# Patient Record
Sex: Male | Born: 1970 | Race: White | Hispanic: No | Marital: Married | Smoking: Light tobacco smoker
Health system: Southern US, Community
[De-identification: ages and names within clinical notes are randomized; demographics above are authoritative.]

## PROBLEM LIST (undated history)

## (undated) HISTORY — PX: BACK SURGERY: SHX140

---

## 2003-02-09 ENCOUNTER — Encounter: Admission: RE | Admit: 2003-02-09 | Discharge: 2003-02-09 | Payer: Self-pay | Admitting: Neurosurgery

## 2003-02-09 ENCOUNTER — Encounter: Payer: Self-pay | Admitting: Neurosurgery

## 2003-05-12 ENCOUNTER — Ambulatory Visit (HOSPITAL_COMMUNITY): Admission: RE | Admit: 2003-05-12 | Discharge: 2003-05-12 | Payer: Self-pay | Admitting: Neurosurgery

## 2003-07-14 ENCOUNTER — Ambulatory Visit (HOSPITAL_COMMUNITY): Admission: RE | Admit: 2003-07-14 | Discharge: 2003-07-14 | Payer: Self-pay | Admitting: Orthopaedic Surgery

## 2003-08-09 ENCOUNTER — Encounter: Admission: RE | Admit: 2003-08-09 | Discharge: 2003-08-09 | Payer: Self-pay | Admitting: Orthopaedic Surgery

## 2009-05-07 HISTORY — PX: WRIST ARTHROSCOPY: SUR100

## 2009-11-18 ENCOUNTER — Ambulatory Visit (HOSPITAL_BASED_OUTPATIENT_CLINIC_OR_DEPARTMENT_OTHER): Admission: RE | Admit: 2009-11-18 | Discharge: 2009-11-18 | Payer: Self-pay | Admitting: Orthopedic Surgery

## 2010-05-27 ENCOUNTER — Encounter: Payer: Self-pay | Admitting: Neurosurgery

## 2010-09-15 ENCOUNTER — Encounter (HOSPITAL_BASED_OUTPATIENT_CLINIC_OR_DEPARTMENT_OTHER): Payer: Self-pay

## 2010-09-22 NOTE — Op Note (Signed)
NAME:  KIMBLE, HITCHENS                            ACCOUNT NO.:  [id number]   MEDICAL RECORD NO.:  [id number]                   PATIENT TYPE:  OIB   LOCATION:  2550                                 FACILITY:  [facility]   PHYSICIAN:  Sharolyn Douglas, M.D.                     DATE OF BIRTH:  07/04/1970   DATE OF PROCEDURE:  DATE OF DISCHARGE:  07/14/2003                                 OPERATIVE REPORT   DIAGNOSIS:  Left L5-S1 herniated nucleus pulposus with radiculopathy.   PROCEDURE:  L5-S1 microdiskectomy and lateral recess decompression.   SURGEON:  Sharolyn Douglas, M.D.   ASSISTANT:  Verlin Fester, P.A.   ANESTHESIA:  General endotracheal.   COMPLICATIONS:  None.   INDICATIONS FOR PROCEDURE:  The patient is a 40 year old male with L5-S1  disk herniation and left S1 radiculopathy secondary to a work-related injury  occurring on July 13, 2002.  He has had a long and appropriate course of  conservative treatment.  He has seen multiple physicians and has had a  second opinion.  Risks, benefits, alternatives to L5-S1 microdiskectomy were  extensively reviewed with the patient.  He elected to proceed.  He does have  some degree of disk degeneration at L4-L5 as well.  He understands that he  may continue to have symptoms from his degenerative joint disease and may  have further disk degeneration requiring additional surgery.  The patient  elected to proceed.   PROCEDURE:  The patient was properly identified in the holding area and  taken to the operating room.  He underwent  general endotracheal anesthesia  without difficulty.  He was given prophylactic IV antibiotics.  He was  carefully turned prone on to the [place] frame.  Only bony prominences were  padded.  Face and eyes were protected at all times.  The back was prepped  and draped in the usual sterile fashion.  Fluoroscopy  was brought into the  field, and the L5-S1 level identified.  A 1.5 cm- incision was made just off  the midline on the  left side over the L5-S1 interspace.  Deep fascia was  incised.  Dilators from the [facility] retractor were used to spread the  paraspinal muscles, docking on the L5-S1 interspace.  This was done using  fluoroscopy.  We then placed the MaXcess retractor over the final dilator  with the 60-mm blades.  The retractor was attached to the table.  The  retractor was spread, dilating the paraspinal muscles.  The microscope was  draped and brought into the field.  The small amount of muscle overlying the  L5-S1 interspace was then removed using a pituitary rongeur.  High-speed  burr was used to remove the inferior one-third of the L5 lamina as well as  the medial one-third of the facet joint complex.  The ligamentum flavum was  elevated piecemeal.  The transversing S1 nerve  root was identified, and  gently mobilized medially.  Bleeding was controlled by Bovie electrocautery  and Gelfoam.  The disk space was entered and the loose disk material was  removed using pituitary rongeurs.  An Epstein curette was used to decompress  the herniated disk material back into the interspace and then removed with  the pituitary sparred.  When we were done, we felt confident the S1 nerve  root was free from its takeoff out its respective foramen using a blunt  probe.   The wound was copiously irrigated.  The disk space was irrigated using  Angiocath irrigation.  Several small fragments were floated out and removed  with the pituitary.  Fentanyl 2 mL was left in the epidural space for  postoperative anesthesia.  The MaXcess retractor was removed.  The fascia  was closed with O-Vicryl suture.  Subcutaneous layer closed with 2-0 Vicryl followed by Dermabond to  approximate the skin edges.  The patient was turned supine and extubated  without difficulty, and transferred to the recovery room in stable  condition, able to move his upper and lower extremities.                                               Sharolyn Douglas,  M.D.    MC/MEDQ  D:  07/14/2003  T:  07/14/2003  Job:  [id number]

## 2010-09-22 NOTE — H&P (Signed)
NAME:  Harold Burns, Harold Burns                            ACCOUNT NO.:  [id number]   MEDICAL RECORD NO.:  [id number]                   PATIENT TYPE:  OIB   LOCATION:  NA                                   FACILITY:  [facility]   PHYSICIAN:  Sharolyn Douglas, M.D.                     DATE OF BIRTH:  04/13/1971   DATE OF ADMISSION:  07/12/2003  DATE OF DISCHARGE:                                HISTORY & PHYSICAL   CHIEF COMPLAINT:  Pain in my back and left leg, with cramping in the right  leg.   HISTORY OF PRESENT ILLNESS:  This 40-yer-old white male is seen by [place] for  continuing and progressive problems concerning pain in his low back with  radiation into both extremities, now more so on the left than the right.  The patient has an injury which occurred on July 14, 2002.  He was loading  some gasoline and fell into his left posterolateral hip area.  Unfortunately  he has continued with progressive pain of an intermittent nature.  He had  tried to continue with his usual work.  He has had early treatment for a  lumbar strain at [facility].  He had chiropractic treatment for awhile.  He received various anti-inflammatories, including IM Toradol.  He has had  conservative treatment by Dr. Caralyn Guile. Ramos.  Due to his continuing  persistent problems and positive findings seen on an MRI of November 26, 2002,  with degenerative disk and facet disease, as well as L5-S1 disk protrusion  abutting the S1 nerve roots bilaterally.  The patient is scheduled for a  microdiskectomy at L5-S1.   PAST MEDICAL HISTORY:  This gentleman has been in excellent health  throughout his life time.  No serious medical illnesses.   ALLERGIES:  TETANUS TOXOID.   PAST SURGICAL HISTORY:  He has had no previous surgeries.   FAMILY HISTORY:  Positive for hypertension and cancer.   SOCIAL HISTORY:  The patient drives a truck.  He is married with one son.  He has no intake of alcohol or tobacco products.   REVIEW OF SYSTEMS:  CNS:  No  seizures, stroke, paralysis, or double vision,  but the patient does have sensory changes consistent with nerve root  impingement in the lower extremities.  CARDIOVASCULAR:  No chest pain, no  angina, no orthopnea.  RESPIRATORY:  No productive cough, no hemoptysis, no  shortness of breath.  GASTROINTESTINAL:  No nausea, vomiting, melena, bloody  stools.  GENITOURINARY:  No discharge, hematuria, or dysuria.  MUSCULOSKELETAL:  Primarily in the history of present illness.   PHYSICAL EXAMINATION:  GENERAL:  Alert and cooperative, friendly, 40-year-  old white male accompanied by his wife and son.  VITAL SIGNS:  Blood pressure 120/74, pulse 72, respirations 12.  HEENT:  Normocephalic.  Pupils equal, round, reactive to light and  accommodation.  EOMs  intact.  Oropharynx clear.  CHEST:  Clear to auscultation.  No rhonchi or rales.  HEART:  A regular rate and rhythm.  No murmurs are heard.  ABDOMEN:  Soft, nontender.  Liver and spleen not felt.  GENITOURINARY:  Not done, no pertinent to the present illness.  RECTAL:  Not done, not pertinent to the present illness.  EXTREMITIES:  The patient has limitation of range of motion in the lumbar  spine, with normal strength in the lower extremities and normal reflexes.   ADMISSION DIAGNOSES:  Herniated nucleus pulposus L5-S1, central.   PLAN:  The patient is to undergo a microdiskectomy at L5-S1 in [place]. [facility].  He will be fitted with a lumbar support, to assist him  in the healing process.      Dooley L. Cherlynn June.                 Sharolyn Douglas, M.D.    DLU/MEDQ  D:  07/12/2003  T:  07/12/2003  Job:  [id number]

## 2011-07-25 ENCOUNTER — Ambulatory Visit: Payer: Self-pay | Admitting: Family Medicine

## 2013-05-28 ENCOUNTER — Ambulatory Visit (INDEPENDENT_AMBULATORY_CARE_PROVIDER_SITE_OTHER): Payer: Commercial Managed Care - PPO | Admitting: Physician Assistant

## 2013-05-28 ENCOUNTER — Encounter: Payer: Self-pay | Admitting: Physician Assistant

## 2013-05-28 VITALS — BP 118/68 | HR 86 | Temp 98.0°F | Resp 16 | Ht 71.0 in | Wt 193.0 lb

## 2013-05-28 DIAGNOSIS — Z0000 Encounter for general adult medical examination without abnormal findings: Secondary | ICD-10-CM

## 2013-05-28 DIAGNOSIS — Z111 Encounter for screening for respiratory tuberculosis: Secondary | ICD-10-CM

## 2013-05-28 NOTE — Progress Notes (Signed)
Patient ID: Harold Burns MRN: [id number], DOB: [birth year] 43 y.o. Date of Encounter: 05/28/2013, 3:02 PM  Primary Physician: No primary provider on file.  Chief Complaint: Physical (CPE)  HPI: 43 y.o. male with history noted below here for CPE. Doing well. Last physical was Sept 2013. He has multiple forms to complete, one for college and one for job. He will be trying out for the [employer] through [employer]. He previously worked at a very active job with [employer] without any issues.   No prior pulmonary issues, history of RAD, allergies, or asthma . He has worked in dusty environments before but has done ok with this type of work. He did have an arthroscopy on his left wrist in 2011. No issues from this procedure, nor is there any lingering pain in his wrist. He feels like he has full strength.   He has never had a PPD before. Asymptomatic.   Review of Systems: Consitutional: No fever, chills, fatigue, night sweats, lymphadenopathy, or weight changes. Eyes: No visual changes, eye redness, or discharge. ENT/Mouth: Ears: No otalgia, tinnitus, hearing loss, discharge. Nose: No congestion, rhinorrhea, sinus pain, or epistaxis. Throat: No sore throat, post nasal drip, or teeth pain. Cardiovascular: No CP, palpitations, diaphoresis, DOE, edema, orthopnea, PND. Respiratory: No cough, hemoptysis, SOB, or wheezing. Gastrointestinal: No anorexia, dysphagia, reflux, pain, nausea, vomiting, hematemesis, diarrhea, constipation, BRBPR, or melena. Genitourinary: No dysuria, frequency, urgency, hematuria, incontinence, nocturia, decreased urinary stream, discharge, impotence, or testicular pain/masses. Musculoskeletal: No decreased ROM, myalgias, stiffness, joint swelling, or weakness. Skin: No rash, erythema, lesion changes, pain, warmth, jaundice, or pruritis. Neurological: No headache, dizziness, syncope, seizures, tremors, memory loss, coordination problems, or  paresthesias. Psychological: No anxiety, depression, hallucinations, SI/HI. Endocrine: No fatigue, polydipsia, polyphagia, polyuria, or known diabetes.   History reviewed. No pertinent past medical history.   Past Surgical History  Procedure Laterality Date  . Wrist arthroscopy Left 2011    No issues    Home Meds:  Prior to Admission medications   Not on File    Allergies: Allergies not on file  History   Social History  . Marital Status: Married    Spouse Name: N/A    Number of Children: N/A  . Years of Education: N/A   Occupational History  . Not on file.   Social History Main Topics  . Smoking status: Not on file  . Smokeless tobacco: Not on file  . Alcohol Use: Not on file  . Drug Use: Not on file  . Sexual Activity: Not on file   Other Topics Concern  . Not on file   Social History Narrative  . No narrative on file    Family History  Problem Relation Age of Onset  . Cancer Father 2353    Throat-smoker  . Heart attack Maternal Grandfather   . Heart attack Paternal Grandmother     Physical Exam: Blood pressure 118/68, pulse 86, temperature 98 F (36.7 C), temperature source Oral, resp. rate 16, height 5\' 11"  (1.803 m), weight 193 lb (87.544 kg), SpO2 99.00%.  General: Well developed, well nourished, in no acute distress. HEENT: Normocephalic, atraumatic. Conjunctiva pink, sclera non-icteric. Pupils 2 mm constricting to 1 mm, round, regular, and equally reactive to light and accomodation. EOMI. Internal auditory canal clear. TMs with good cone of light and without pathology. Nasal mucosa pink. Nares are without discharge. No sinus tenderness. Oral mucosa pink. Dentition normal. Pharynx without exudate.   Neck: Supple. Trachea midline. No  thyromegaly. Full ROM. No lymphadenopathy. Lungs: Clear to auscultation bilaterally without wheezes, rales, or rhonchi. Breathing is of normal effort and unlabored. Cardiovascular: RRR with S1 S2. No murmurs, rubs, or  gallops appreciated. Distal pulses 2+ symmetrically. No carotid or abdominal bruits. Abdomen: Soft, non-tender, non-distended with normoactive bowel sounds. No hepatosplenomegaly or masses. No rebound/guarding. No CVA tenderness. Without hernias.   Genitourinary: Circumcised male. No penile lesions. Testes descended bilaterally, and smooth without tenderness or masses.  Musculoskeletal: Full range of motion and 5/5 strength throughout. Without swelling, atrophy, tenderness, crepitus, or warmth. Extremities without clubbing, cyanosis, or edema. Calves supple. Skin: Multiple forearm tattoos bilaterally. Warm and moist without erythema, ecchymosis, wounds, or rash. Neuro: A+Ox3. CN II-XII grossly intact. Moves all extremities spontaneously. Full sensation throughout. Normal gait. DTR 2+ throughout upper and lower extremities. Finger to nose intact. Psych:  Responds to questions appropriately with a normal affect.   Studies: Blood: trace, o/w unremarkable    Assessment/Plan:  43 y.o. male here for CPE with multiple form completion.  -Cleared  -Forms completed -PPD placed, RTC 48-72 hours for PPD reading -Discussed trace hematuria and advised patient to follow up with this in several weeks after completion of his course -Healthy diet and exercise -Weight loss -Age appropriate anticipatory guidance    Signed, Eula Listen, PA-C [facility] [place], [place] [address] [phone] 05/28/2013 3:02 PM

## 2013-05-30 ENCOUNTER — Ambulatory Visit (INDEPENDENT_AMBULATORY_CARE_PROVIDER_SITE_OTHER): Payer: Commercial Managed Care - PPO | Admitting: *Deleted

## 2013-05-30 DIAGNOSIS — Z111 Encounter for screening for respiratory tuberculosis: Secondary | ICD-10-CM

## 2013-05-30 LAB — TB SKIN TEST
INDURATION: 0 mm
TB SKIN TEST: NEGATIVE

## 2015-06-06 ENCOUNTER — Encounter: Payer: Self-pay | Admitting: *Deleted

## 2015-06-06 ENCOUNTER — Emergency Department
Admission: EM | Admit: 2015-06-06 | Discharge: 2015-06-06 | Disposition: A | Discharge status: Home / Self Care | Payer: 59 | Source: Home / Self Care | Attending: Family Medicine | Admitting: Family Medicine

## 2015-06-06 ENCOUNTER — Emergency Department (INDEPENDENT_AMBULATORY_CARE_PROVIDER_SITE_OTHER): Payer: 59

## 2015-06-06 DIAGNOSIS — S63501A Unspecified sprain of right wrist, initial encounter: Secondary | ICD-10-CM | POA: Diagnosis not present

## 2015-06-06 DIAGNOSIS — M79641 Pain in right hand: Secondary | ICD-10-CM | POA: Diagnosis not present

## 2015-06-06 DIAGNOSIS — R52 Pain, unspecified: Secondary | ICD-10-CM | POA: Diagnosis not present

## 2015-06-06 DIAGNOSIS — S60221A Contusion of right hand, initial encounter: Secondary | ICD-10-CM

## 2015-06-06 DIAGNOSIS — M25541 Pain in joints of right hand: Secondary | ICD-10-CM | POA: Diagnosis not present

## 2015-06-06 DIAGNOSIS — M25531 Pain in right wrist: Secondary | ICD-10-CM

## 2015-06-06 NOTE — ED Notes (Signed)
Pt was repeatedly banging/knocking on a door last night with medical side of fist. About 4 hours later developed pain, limited ROM. No previous injury.

## 2015-06-06 NOTE — ED Provider Notes (Signed)
CSN: [id number]     Arrival date & time 06/06/15  1040 History   First MD Initiated Contact with Patient 06/06/15 1142     Chief Complaint  Patient presents with  . Wrist Pain   (Consider location/radiation/quality/duration/timing/severity/associated sxs/prior Treatment) HPI Here with wife. Last night, he repeatedly was binding and knocking on the door last night, developed diffuse pain and mild swelling and decreased range of motion right hand and wrist. Pain is 3 out of 10 at rest, 6 out of 10 with movement. Has not tried any particular treatment other than rest. No associated numbness or weakness. History reviewed. No pertinent past medical history. Past Surgical History  Procedure Laterality Date  . Wrist arthroscopy Left 2011    No issues  . Back surgery     Family History  Problem Relation Age of Onset  . Cancer Father 44    Throat-smoker  . Heart attack Maternal Grandfather   . Heart attack Paternal Grandmother    Social History  Substance Use Topics  . Smoking status: Light Tobacco Smoker  . Smokeless tobacco: None  . Alcohol Use: No    Review of Systems  All other systems reviewed and are negative.   Allergies  Tetanus toxoids  Home Medications   Prior to Admission medications   Not on File   Meds Ordered and Administered this Visit  Medications - No data to display  BP 130/86 mmHg  Pulse 82  Resp 16  Ht  (1.803 m)  Wt 198 lb (89.812 kg)  BMI 27.63 kg/m2  SpO2 99% No data found.   Physical Exam  Constitutional: He is oriented to person, place, and time. He appears well-developed and well-nourished. No distress.  HENT:  Head: Normocephalic and atraumatic.  Eyes: Conjunctivae and EOM are normal. Pupils are equal, round, and reactive to light. No scleral icterus.  Neck: Normal range of motion.  Cardiovascular: Normal rate.   Pulmonary/Chest: Effort normal.  Abdominal: He exhibits no distension.  Musculoskeletal: Normal range of motion.   Right hand: Minimally swollen, diffusely tender with point tenderness over first and second metacarpals. Mildly decreased range of motion. Pain exacerbated by handgrip. Neurovascular distally intact.  Right wrist: Diffusely tender with specific point tenderness over distal radius. Motion exacerbates the pain but range of motion intact. Tendons and strength intact. No open wound. No redness or warmth.  Neurological: He is alert and oriented to person, place, and time.  Skin: Skin is warm.  Psychiatric: He has a normal mood and affect.  Nursing note and vitals reviewed.   ED Course  Procedures (including critical care time)  Labs Review Labs Reviewed - No data to display  Imaging Review Dg Wrist Complete Right  06/06/2015  CLINICAL DATA:  Pain, limited range of motion. Pt was knocking/banging on a door last night with RIGHT hand. Noticed pain about 4 hours later along the lateral side of his hand from his thumb to his wrist. Swelling noticed in his thumb, pointer finger, and middle finger. LROM in his fingers, especially his thumb. EXAM: RIGHT WRIST - COMPLETE 3+ VIEW COMPARISON:  None. FINDINGS: There is no evidence of fracture or dislocation. 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 Soft tissues are unremarkable. IMPRESSION: No acute osseous injury of the right wrist. Electronically Signed   By: Elige Ko   On: 06/06/2015 11:54   Dg Hand Complete Right  06/06/2015  CLINICAL DATA:  Pt was knocking/banging on a door last night with RIGHT  hand. Noticed pain about 4 hours later along the lateral side of his hand from his thumb to his wrist. Swelling noticed in his thumb, pointer finger, and middle finger. LROM in his fingers, especially his thumb. EXAM: RIGHT HAND - COMPLETE 3+ VIEW COMPARISON:  None. FINDINGS: There is no evidence of fracture or dislocation. 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 There is no aggressive osseous lesion. Soft tissues are unremarkable.  IMPRESSION: No acute osseous injury of the right hand. Electronically Signed   By: Elige Ko   On: 06/06/2015 11:55    MDM   1. Right wrist sprain, initial encounter   2. Pain   3. Contusion of right hand, initial encounter    Reviewed x-ray reports above, no fracture or dislocation of right wrist or hand. Treatment options discussed, as well as risks, benefits, alternatives. Patient voiced understanding and agreement with the following plans:  Thumb spica splint applied. Ace and other symptomatic care. OTC Aleve prn [pain.  He declined any prescription NSAID or prescription pain med. Follow-up with orthopedics or sports medicine if no better 7 days, sooner if worse or new symptoms. Red flags discussed. Patient and wife voiced understanding and agreement with above plans.   Lajean Manes, MD 06/06/15 [phone]

## 2015-07-22 DIAGNOSIS — H524 Presbyopia: Secondary | ICD-10-CM | POA: Diagnosis not present

## 2017-02-16 ENCOUNTER — Emergency Department
Admission: EM | Admit: 2017-02-16 | Discharge: 2017-02-16 | Disposition: A | Discharge status: Home / Self Care | Payer: Self-pay | Source: Home / Self Care

## 2017-05-09 IMAGING — CR DG HAND COMPLETE 3+V*R*
3 series · 3 of 3 positions shown · non-contrast
Comparison: None.

CLINICAL DATA: Pt was knocking/banging on a door last night with
RIGHT hand. Noticed pain about 4 hours later along the lateral side
of his hand from his thumb to his wrist. Swelling noticed in his
thumb, pointer finger, and middle finger. LROM in his fingers,
especially his thumb.

EXAM:
RIGHT HAND - COMPLETE 3+ VIEW

[hand pa]
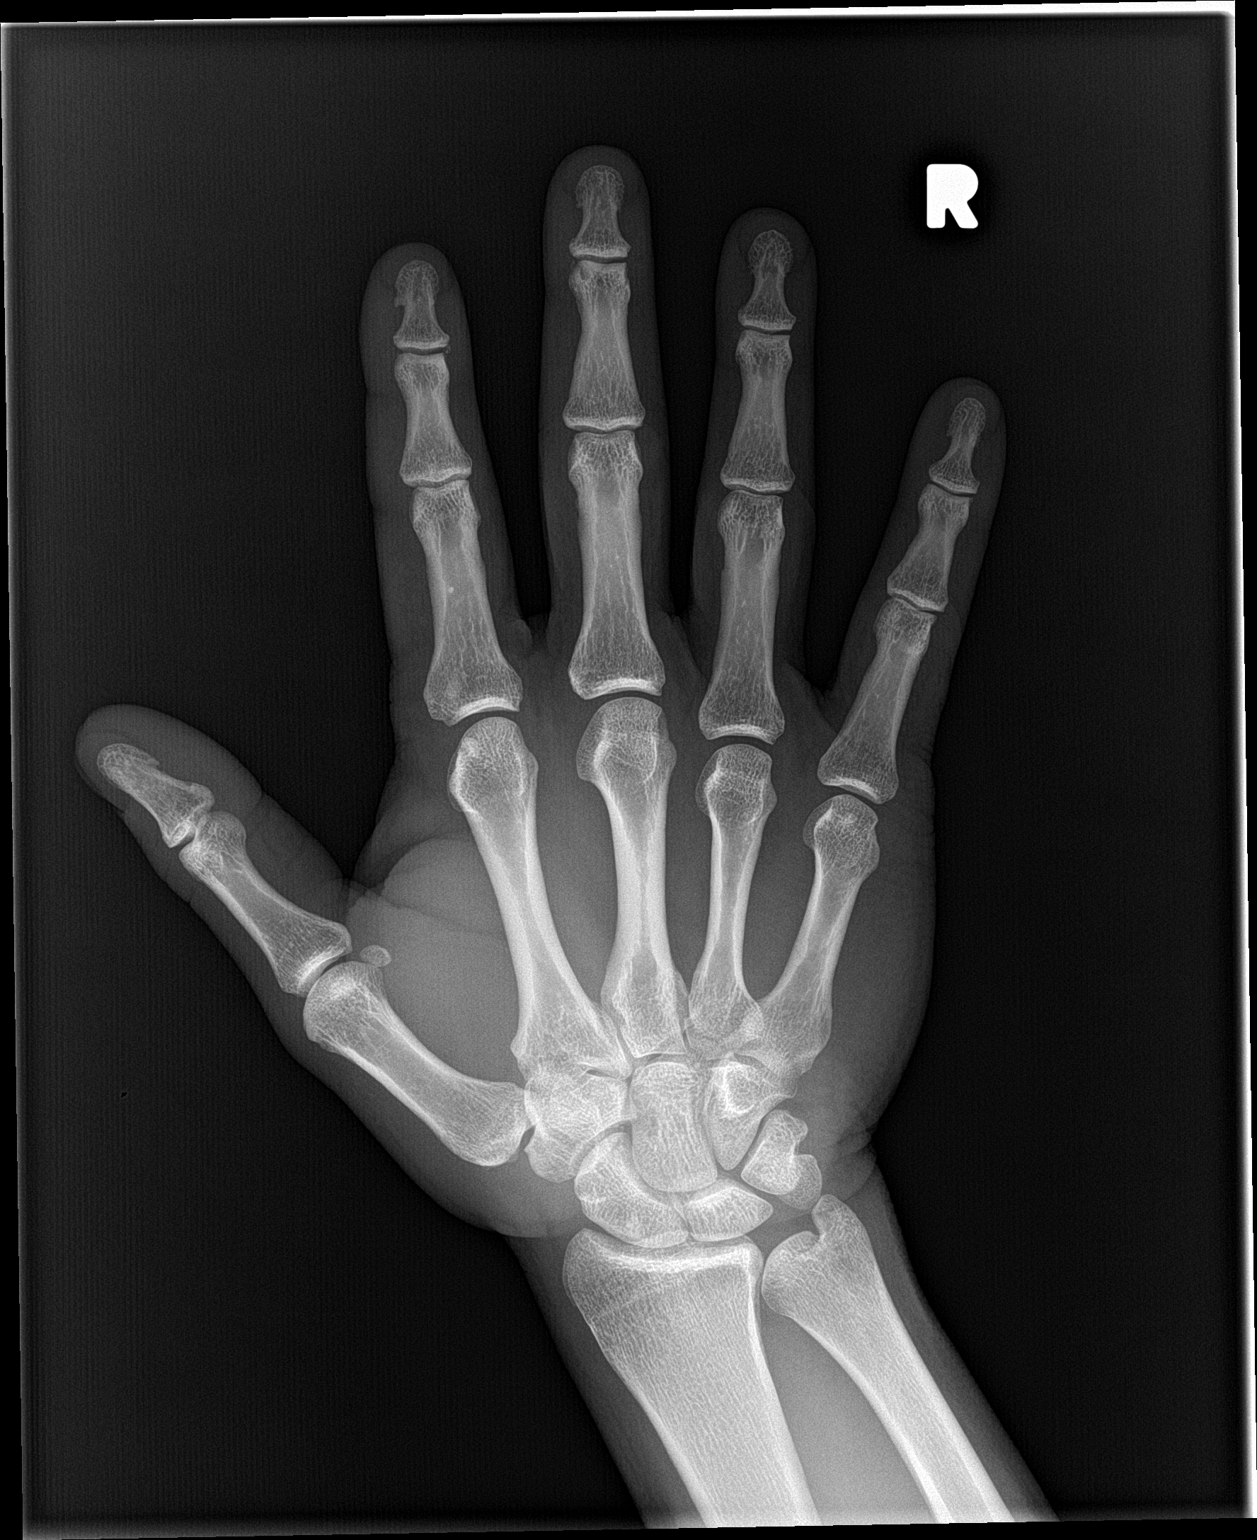

[hand obl]
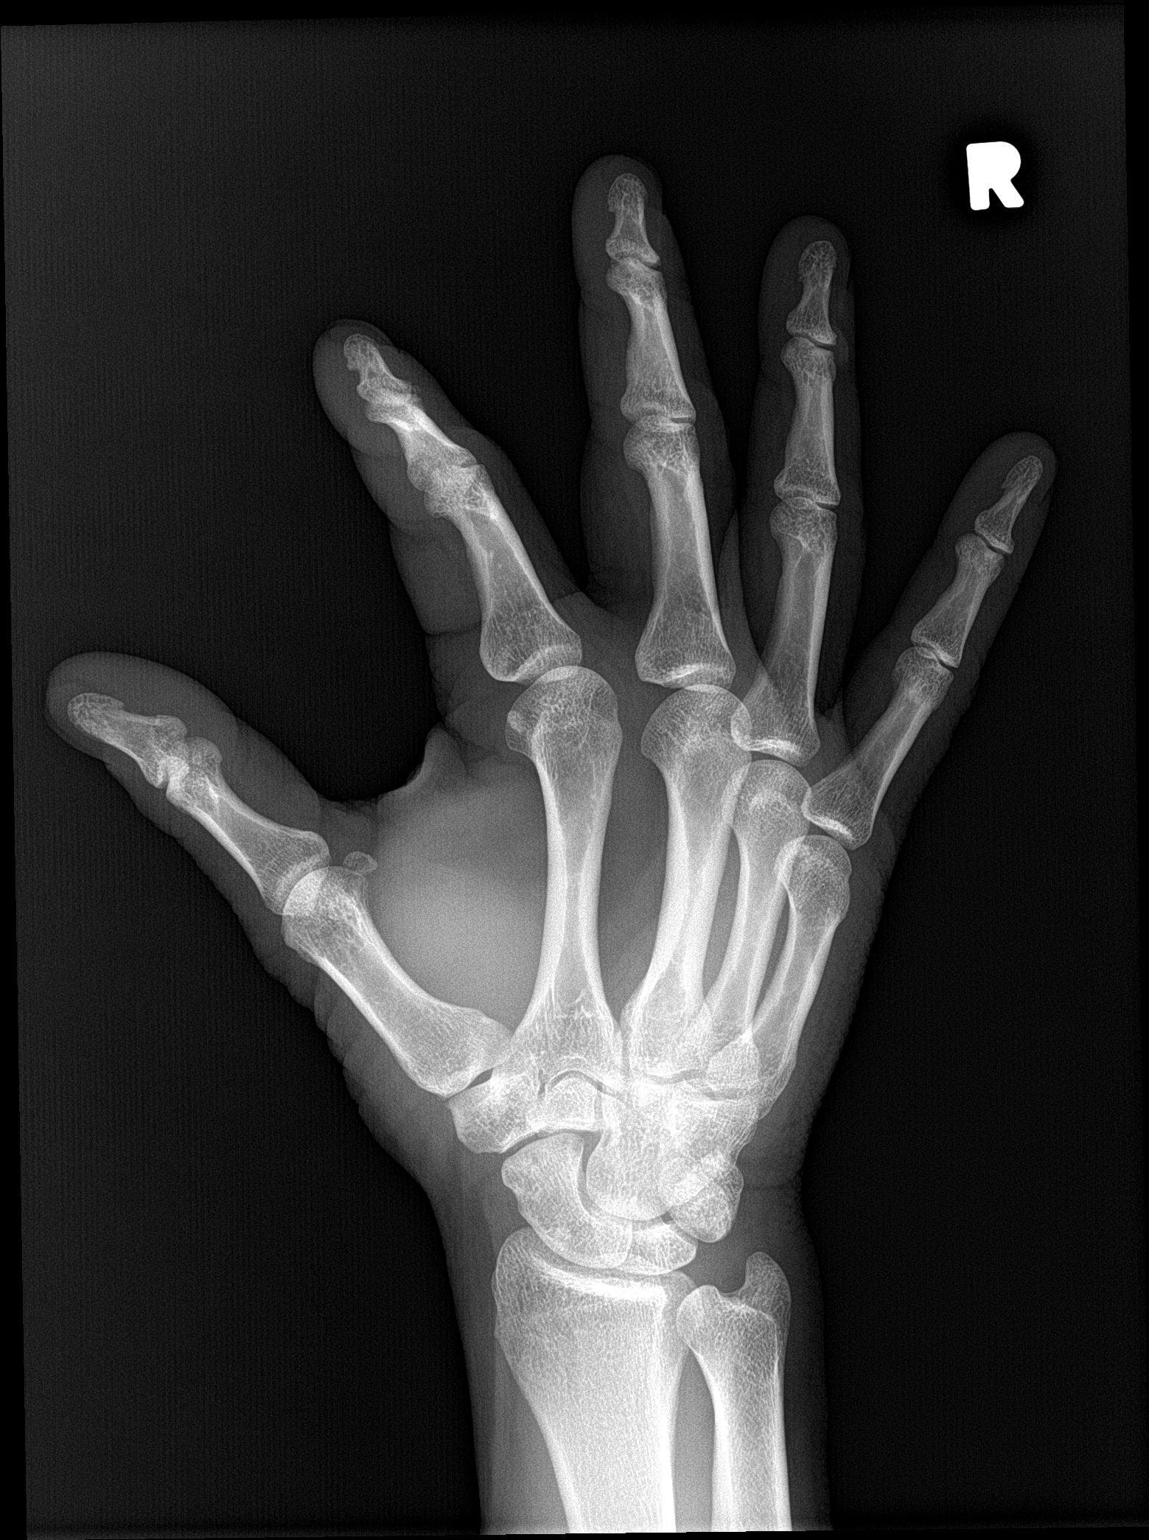

[hand lat]
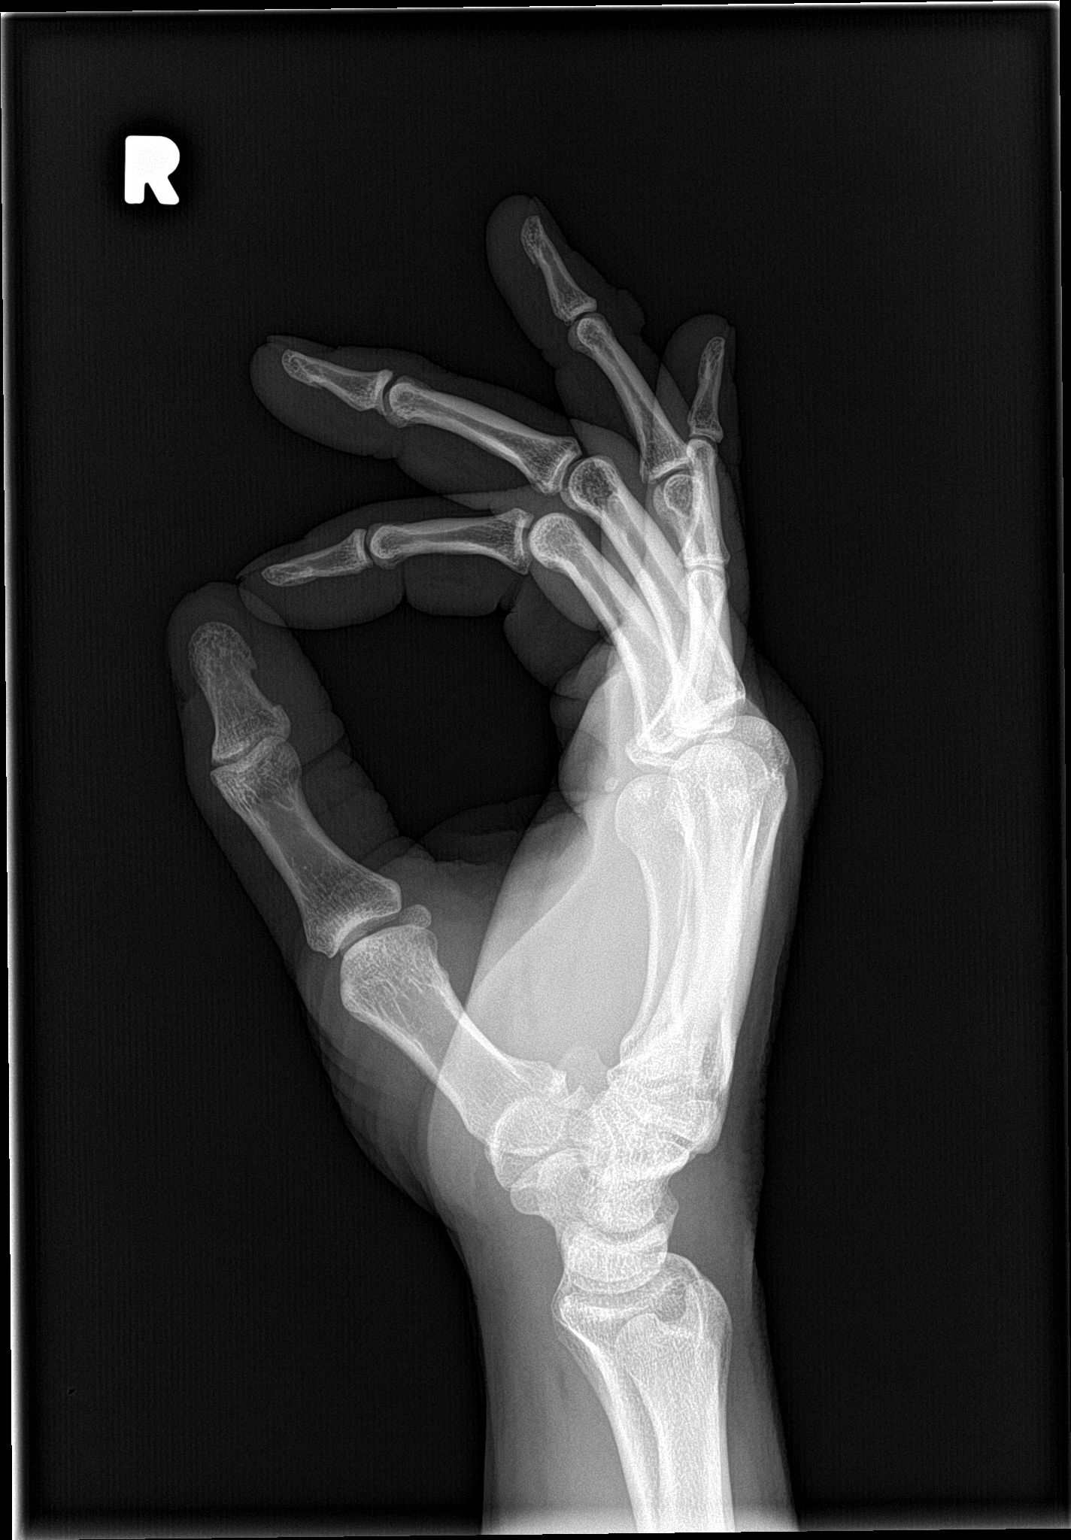

[3 of 3 positions shown; findings below may reference images not displayed]

FINDINGS: There is no evidence of fracture or dislocation. There is mild
osteoarthritis of the first CMC joint. There is no aggressive
osseous lesion. Soft tissues are unremarkable.
IMPRESSION: No acute osseous injury of the right hand.
